# Patient Record
Sex: Male | Born: 1990 | Hispanic: No | Marital: Single | Smoking: Former smoker
Health system: Southern US, Community
[De-identification: ages and names within clinical notes are randomized; demographics above are authoritative.]

## PROBLEM LIST (undated history)

## (undated) HISTORY — PX: APPENDECTOMY: SHX54

---
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
## 2010-09-19 NOTE — H&P (Signed)
NAMEJENCARLOS, NICOLSON             ACCOUNT NO.:  [id number]   MEDICAL RECORD NO.:  [id number]          PATIENT TYPE:  INP   LOCATION:  0098                         FACILITY:  [facility]   PHYSICIAN:  Anselm Pancoast. Weatherly, M.D.DATE OF BIRTH:  05-08-1990   DATE OF ADMISSION:  10/22/2006  DATE OF DISCHARGE:                              HISTORY & PHYSICAL   CHIEF COMPLAINT:  Abdominal pain.   HISTORY:  Colin Fields is a 20 year old Caucasian male who came to  emergency room accompanied by his mother with the following history.   He has had right-sided lower abdominal pain progressive for  approximately 5 weeks.  The pain has gotten much worse over the last 3  days.  His mother says she had noticed that he was not able to stand up  straight.  She did a bunch of internet search and sort of self diagnosed  him as possibly having appendicitis.  For the last 3 days he has had  significant increasing pain; has is started having fever and came to the  emergency room today probably at 3:00 p.m.  He was seen by the ER  physician while I was in the operating room.  They started him on Zosyn,  as he had a CT which showed a markedly inflamed appendix.  He had come  by [facility] and brought copies of the x-ray on the disk.  I reviewed  this and he obviously had a ruptured retrocecal appendix with marked  inflammation of the right lower quadrant.  He has had no laboratory  studies.   PHYSICAL EXAMINATION:  (In the emergency room)  GENERAL:  He is a cooperative young male.  He has rings or buttons on  his lower lips and very large earrings.  He appears uncomfortable.  VITAL SIGNS:  His temperature was 101.9, and I gave him a Tylenol  suppository.  His pulse was originally 116, and then later checked and  it was approximately 100.  His respiration was 20.  Blood pressure was  134/70.  HEENT:  Unremarkable.  LUNGS:  Clear.  ABDOMEN:  He is definitely tender in the lower abdomen.  He seems to  have a fullness in the right lower quadrant.  He definitely has rebound  localized to the right lower abdomen.  RECTAL:  I did not do a rectal examination.  EXTREMITIES:  No pedal edema.   DATA REVIEW:  I reviewed the CT with the patient's mom, and it is  recommended that we proceed with an open appendectomy, since he had a  markedly inflamed appendix retrocecal with fluid in the pelvis, and what  looks like localized infection at the base of the appendix.  This  patient has been started on the Unasyn, his CT was without oral  contrast.  I did review the CT with our radiologist who agreed with the  impression from [facility].   preop diagnosis her ruptured appendicitis, retrocecal postop diagnosis  same operation was in the past appendectomy for ruptured appendix open.  General anesthesia surgery.  We will assisted nurse history Shahiem  noted to the 20 year old male who has  had the, reoccurring progressive  right lower quadrant pain of the patient says 5 weeks duration that is,  and intermittent cramping in the left foot and come back and then over  the last 3 days he has had a significant increasing pain and started  having fever.  He was 71 clinics day and a set of the side face and for  a CT this was done without contrast that shows a markedly inflamed  retrocecal appendix.  He was referred here to the emergency room and we  obtained a CBC and seen that his electrolytes were normal.  BUN of nine  his white count is 15,600 with a medical bit of 46.  As a marked left  shift.  The patient has been given Zosyn intravenously and permission  obtained for an open appendectomy.   The patient was taken to the operative suite position of a table  endotracheal tube and the abdomen after induction of general anesthesia  can feel a definite mass and right lower quadrant first the catheter was  placed is blast early.  A small Rockey-Davis type incision and right  lower quadrant was made  sharp infection and to the skin and subcutaneous  tissue external oblique internal bleed and transversalis the carefully  opened into the peritoneal cavity fortunately there is not a generalized  peritonitis that is.  This the liver tori mass and he really could not  identify where the cecum in the appendix joins since this is kind of  chronically the inflamed.  I can elevate the cecum and then could  identified with where the appendix was just, plastic to the cecum, broke  get up with fingers and the carefully separated the cecum from this  markedly inflamed appendix.  The appendix had ruptured, little walled  off abscess pretty close to its junction with the cecum and the  inflammatory was kind of carefully separated trying to make sure that  did perforated the cecum as we would under trying to separate the  appendix from the cecum.  Hopefully this can be done without actually  had the do a right colectomy on the patient.  The terminal ileum and  junction with the cecum is course his normal physician and after I kind  of free up the mesentery to the appendix.  I can then say that I could  take of TIA 60 across the base of the cecum and actually removed the  appendix in probably about 2 cm of the cecum and get all the  inflammation removed and actually pill across the cecum words not  extremely sick.  This was done the stapler fired and then the inflamed  appendix removed.  I did culture the little periappendiceal abscess the  room and aerobic being thoroughly irrigated and aspirated.  I changed  above.  And then inverted the suture line with interrupted members  sutures of 3-0 silk and recheck where the terminal EOMs cecal junction  and he is in do not think, compromise the ileocecal valve area.  I then  dropped the cecum back in his normal physician to rule second, aspirated  the fluid that was seen on the CT of the pelvis and this looks more like a exudates or transudates and actually  a pelvic abscess.  I placed a  Blake drain in around across the base where the appendix and then  removed and a portion of this, partially goes to the pelvis.  I have  reinspected good hemostasis  sponge count was correct and then I close  the transversalis perineum running 2-0 Vicryl the internal bleed was  closed with interrupted 2-0 Vicryl and the external oblique was closed  with interrupted 2-0 Vicryl irrigated and aspirated between each layers  and then approximately the a Scarpa fascia with two simple sutures of 4-  0 Vicryl and benzoin and Steri-Strips on the skin the drainage been  placed through the abdominal wall below the incision and this was  sutured to the skin with 3-0 silk the patient awake I did remove the  Foley prior to him being awakened and we will keep him n.p.o. keep more  broad antibiotics and then hopefully be able to started the left ear the  to tomorrow.  Was tried not place an NG tube.  Closed.  This will be  added in the transition for into the pediatric is kind.  This.  We will  repeat his operative not as much the hands were correct and estimated  loss was minimal.  Thank you he would           ______________________________  Anselm Pancoast. Zachery Dakins, M.D.     WJW/MEDQ  D:  10/22/2006  T:  10/23/2006  Job:  [id number]

## 2010-09-19 NOTE — Op Note (Signed)
NAMETHEOPHILE, Colin Fields             ACCOUNT NO.:  [id number]   MEDICAL RECORD NO.:  [id number]          PATIENT TYPE:  INP   LOCATION:  0098                         FACILITY:  [facility]   PHYSICIAN:  Anselm Pancoast. Weatherly, M.D.DATE OF BIRTH:  1990/10/06   DATE OF PROCEDURE:  DATE OF DISCHARGE:                               OPERATIVE REPORT   PREOPERATIVE DIAGNOSIS:  Her ruptured appendicitis, retrocecal.   POSTOPERATIVE DIAGNOSIS:  Her ruptured appendicitis, retrocecal.   OPERATION:  Appendectomy for ruptured appendix, open.   ANESTHESIA:  General anesthesia.   SURGEON:  Anselm Pancoast. Zachery Dakins, M.D.   ASSISTANT:  Nurse.   HISTORY:  Colin Fields is a 20 year old male who has had kind of a  reoccurring, progressive right lower quadrant pain of, the patient says,  5 weeks duration.  This has been kind of intermittent and cramping; it  gets less but then comes back.  Then, over the last 3 days, he has had a  significant increasing pain and started having fever.  He was seen in  one of the clinics today and they sent him over to [facility] for a  CT.  This was done without contrast and it showed a markedly inflamed  retrocecal appendix.  He was referred here to the emergency room and we  obtained a CBC and CMET.  His electrolytes were normal.  BUN 9.  His  white count was 15,600, with hematocrit of 46.  He had a marked left  shift.  The patient had been given Zosyn intravenously and permission  was obtained for an open appendectomy.   DESCRIPTION OF PROCEDURE:  The patient was taken to the operative suite;  positioned on the operating room table.  After induction of general  anesthesia, you could feel a definite mass in the right lower quadrant.  First the catheter was placed in his bladder sterilely.  A small Rockey-  Davis type incision in the right lower quadrant was made.  Sharp  dissection made down through the skin and subcutaneous tissue, external  oblique internal oblique  and then transversalis.  Then carefully we  opened into the peritoneal cavity, Fortunately, there was not a  generalized peritonitis, but he had this big inflammatory mass and you  really could not identify where the cecum and the appendix joins --  since this was just kind of chronically inflamed.  I could elevate the  cecum, and then could identify where the appendix was just  kind of  plastered to the cecum.  I kind of broke it up with my fingers and then  carefully separated the cecum from this markedly inflamed appendix.  The  appendix had ruptured, and had kind of a little walled-off abscess  pretty close to its junction with the cecum.  The inflammatory tissue  was kind of carefully separated, trying to make sure that I did not  perforate the cecum, as we were kind of trying to separate the appendix  from the cecum.  Hopefully this can be done without actually having to  do a right colectomy on the patient.  The terminal ileum and junction  with the cecum was in the normal position, and after I had kind of freed  up the mesentery to the appendix, I could then see that I could take TIA  60 across the base of the cecum.  I actually removed the appendix and  probably about 2 cm of the cecum in order to get all the inflammation  removed.  I actually came across the cecum where it was not extremely  thick.  When this was done the stapler fired, and then the inflamed  appendix removed.  I did culture the little periappendiceal abscess;  aerobic and anaerobic being thoroughly irrigated and aspirated.   I changed my gloves, and then I inverted the suture line with  interrupted Lembert sutures of 3-0 silk.  I rechecked where the terminal-  cecal junction is; and I did not think I had compromised the ileocecal  valve area.  I then dropped the cecum back into the normal position.  I  stuck a Poole sucker and kind of aspirated the fluid that was seen on  the CT of the pelvis.  This looked more  like an exudate or transudate  than actually a pelvic abscess.  I placed a Blake drain in, around and  across the base where the appendix had been removed; and a portion of  this kind of partially goes into the pelvis.  I reinspected; good  hemostasis.  The sponge count was correct.   I then closed the transversalis and peritoneum with running 2-0 Vicryl.  The internal oblique was closed with interrupted 2-0 Vicryl, and the  external oblique was closed with interrupted 2-0 Vicryl.  I irrigated  and aspirated between each of the layers.  I then approximated the  Scarpa fascia with 2 simple sutures of 4-0 Vicryl; and benzoin and Steri-  Strips on the skin.  The drain has been placed through the abdominal  wall below the incision, and this was sutured to the skin with 3-0 silk.   The patient awoke.  I did remove the Foley prior to him being awakened.  We will keep him n.p.o.; keep him a broad antibiotic, and then hopefully  will be able to start advancing the diet tomorrow.  I am going to try  and not place an NG tube.   I understand that there are no beds, and they are going  to transfer him  to the pediatric units at [facility].   COUNT:  Sponge and needle counts were correct.   ESTIMATED BLOOD LOSS:  Minimal.           ______________________________  Anselm Pancoast. Zachery Dakins, M.D.     WJW/MEDQ  D:  10/22/2006  T:  10/23/2006  Job:  [id number]

## 2010-09-22 NOTE — Discharge Summary (Signed)
[name], Colin Fields             ACCOUNT NO.:  [id number]   MEDICAL RECORD NO.:  [id number]          PATIENT TYPE:  INP   LOCATION:  6119                         FACILITY:  [facility]   PHYSICIAN:  Anselm Pancoast. 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  Operation was opened, appendectomy with removal of  small area of cecum.   HISTORY:  Colin Fields is a 20 year old male who came to the  emergency room at [facility] with the following history.  He had right-  sided lower abdominal pain for approximately five weeks.  The patient  had gotten much worse over the last three days.  Mother said she first  noticed when he was not able to stand up straight.  The patient and his  mother did an Therapist, art and diagnosed him as having appendicitis  and, for the last three days, he has had increasing pain with fever and  then came to the emergency room at [facility] on October 22, 2006.  He was  seen by the ER physician and they started him on Zosyn.  Did a CT which  showed a marked inflammatory mass in the right lower quadrant.  This was  done at [facility] before coming to the emergency room and it was  consistent with a marked inflammation and a retrocecal appendicitis.  He  had no laboratory studies and on examination his temperature was 102,  pulse was 100, blood pressure was 134/70.  When I saw him, he was  definitely tender with a fullness on the right lower quadrant.  There  was no pedal edema and I was in agreement with the antibiotics.  His BUN  was 9.  His white count was 15,600 and I discussed with the mother and  the patient that I would proceed with an open appendectomy.  He was  taken to the OR at [facility] shortly afterwards and had a markedly  inflamed kind of a mass  retrocecally.  We were able to kind of flip up  the cecum and all this edematous areolar tissues and etc. and it was  definitely a small area of free purulence and the appendix was inflamed  right on the basically to the base of the cecum but I could get a  stapler under it across the base of the cecum, not the appendix, that  did not compromise the ileocecal valve and I __________  to remove this  inflammatory process.  The pathologist confirmed that this is definitely  acutely ruptured appendicitis.  The portion of the cecum is benign but  showed marked inflammation.  There were no beds and, since he was a  pediatric patient, he was transferred to the pediatric unit here at [facility]  as mother and family members stayed with him most of the time and it was  necessary to encourage him to cough, deep breath and etc.  He had a  Blake drain  that had very little drainage.  At first, he did have some  fever and I expect it was probably more related to pulmonary toilet  coughing than actually progression of the inflammatory process.  After  approximately 24 hours, however, he clinically improved.  We started him  on a liquid diet.  His temperature came down and a repeat white count  showed improvement.  He was started on a liquid diet and this advanced.  A white count was done on a Saturday which was about four days after he  was admitted and the white count returned to normal and he was ready for  discharge the following morning.  He still has his Blake drain and I  removed the [place] drain immediately before discharging him and will  follow up in the office in approximately a week.  I am going to  discharge him to continue oral antibiotics, Augmentin 875 mg b.i.d. for  five days, Vicodin for pain and he can shower the day after the drain  was removed and will remove the skin staples in the office.           ______________________________  Anselm Pancoast. Zachery Dakins, M.D.    WJW/MEDQ  D:  12/05/2006   T:  12/05/2006  Job:  [id number]

## 2011-02-21 LAB — DIFFERENTIAL
Basophils Absolute: 0
Basophils Absolute: 0
Basophils Relative: 0
Eosinophils Absolute: 0.1
Eosinophils Relative: 1
Lymphocytes Relative: 10 — ABNORMAL LOW
Lymphocytes Relative: 16 — ABNORMAL LOW
Lymphocytes Relative: 6 — ABNORMAL LOW
Lymphs Abs: 1.5
Monocytes Absolute: 0.8
Monocytes Relative: 6
Neutro Abs: 11.8 — ABNORMAL HIGH
Neutro Abs: 13.2 — ABNORMAL HIGH
Neutrophils Relative %: 85 — ABNORMAL HIGH

## 2011-02-21 LAB — CBC
HCT: 36.4
HCT: 40.1
HCT: 45.9 — ABNORMAL HIGH
Hemoglobin: 13.1
Hemoglobin: 13.5
MCHC: 33.6
MCHC: 34.2 — ABNORMAL HIGH
MCV: 83.8
MCV: 85.4
Platelets: 250
Platelets: 254
RBC: 4.57
RBC: 5.48 — ABNORMAL HIGH
RDW: 12.6
WBC: 11.7
WBC: 13.4 — ABNORMAL HIGH

## 2011-02-21 LAB — ANAEROBIC CULTURE

## 2011-02-21 LAB — COMPREHENSIVE METABOLIC PANEL
AST: 17
BUN: 9
CO2: 26
Calcium: 10.2
Creatinine, Ser: 0.91

## 2011-02-21 LAB — BASIC METABOLIC PANEL
BUN: 4 — ABNORMAL LOW
Chloride: 100
Glucose, Bld: 128 — ABNORMAL HIGH
Potassium: 4.1

## 2021-08-26 ENCOUNTER — Encounter (HOSPITAL_COMMUNITY): Payer: Self-pay

## 2021-08-26 ENCOUNTER — Other Ambulatory Visit: Payer: Self-pay

## 2021-08-26 ENCOUNTER — Emergency Department (HOSPITAL_COMMUNITY)
Admission: EM | Admit: 2021-08-26 | Discharge: 2021-08-26 | Disposition: A | Discharge status: Home / Self Care | Payer: Self-pay | Attending: Emergency Medicine | Admitting: Emergency Medicine

## 2021-08-26 DIAGNOSIS — H9203 Otalgia, bilateral: Secondary | ICD-10-CM | POA: Insufficient documentation

## 2021-08-26 DIAGNOSIS — J069 Acute upper respiratory infection, unspecified: Secondary | ICD-10-CM | POA: Insufficient documentation

## 2021-08-26 DIAGNOSIS — J029 Acute pharyngitis, unspecified: Secondary | ICD-10-CM

## 2021-08-26 DIAGNOSIS — Z20822 Contact with and (suspected) exposure to covid-19: Secondary | ICD-10-CM | POA: Insufficient documentation

## 2021-08-26 DIAGNOSIS — M542 Cervicalgia: Secondary | ICD-10-CM | POA: Insufficient documentation

## 2021-08-26 LAB — RESP PANEL BY RT-PCR (FLU A&B, COVID) ARPGX2
Influenza A by PCR: NEGATIVE
Influenza B by PCR: NEGATIVE
SARS Coronavirus 2 by RT PCR: NEGATIVE

## 2021-08-26 LAB — GROUP A STREP BY PCR: Group A Strep by PCR: NOT DETECTED

## 2021-08-26 NOTE — ED Notes (Signed)
Pt is able to place chin to chest and has full ROM of neck.  Sts soreness is mainly in the morning.  ?

## 2021-08-26 NOTE — ED Provider Notes (Signed)
?[facility]-EMERGENCY DEPT ?Provider Note ? ? ?CSN: [id number] ?Arrival date & time: 08/26/21  1823 ? ?  ? ?History ? ?Chief Complaint  ?Patient presents with  ? URI  ? Fever  ? ? ?Colin Fields is a 31 y.o. male who presents to the ED complaining of sore throat onset 3 days.  Has possible sick contacts at his place of employment.  Has associated nasal congestion, bilateral ear pressure, fever, neck pain, painful swallowing.  Has tried naproxen with his last dose being an hour prior to arrival.  Denies trouble swallowing. ? ?The history is provided by the patient. No language interpreter was used.  ? ?  ? ?Home Medications ?Prior to Admission medications   ?Not on File  ?   ? ?Allergies    ?Patient has no known allergies.   ? ?Review of Systems   ?Review of Systems  ?Constitutional:  Positive for fever.  ?HENT:  Positive for congestion, ear pain and sore throat. Negative for trouble swallowing.   ?Respiratory:  Positive for cough.   ?Musculoskeletal:  Positive for neck pain.  ?All other systems reviewed and are negative. ? ?Physical Exam ?Updated Vital Signs ?BP (!) 153/88 (BP Location: Left Arm)   Pulse (!) 105   Temp 99.7 ?F (37.6 ?C) (Oral)   Resp 18   Ht 5\' 11"  (1.803 m)   Wt 95.3 kg   SpO2 96%   BMI 29.29 kg/m?  ?Physical Exam ?Vitals and nursing note reviewed.  ?Constitutional:   ?   General: He is not in acute distress. ?   Appearance: He is not diaphoretic.  ?HENT:  ?   Head: Normocephalic and atraumatic.  ?   Right Ear: Tympanic membrane, ear canal and external ear normal.  ?   Left Ear: Tympanic membrane, ear canal and external ear normal.  ?   Nose: Nose normal.  ?   Mouth/Throat:  ?   Mouth: Mucous membranes are moist.  ?   Pharynx: Oropharynx is clear. Uvula midline. No oropharyngeal exudate or uvula swelling.  ?   Tonsils: Tonsillar exudate present.  ?   Comments: Exudate noted to left tonsil.  Uvula midline without swelling. ?Eyes:  ?   General: No scleral icterus. ?    Conjunctiva/sclera: Conjunctivae normal.  ?Neck:  ?   Meningeal: Brudzinski's sign and Kernig's sign absent.  ?   Comments: Full active range of motion of neck without pain.  No cervical spinal tenderness to palpation.  No tenderness to palpation noted to musculature of cervical spine.  Negative presents keys and negative Kernig sign. ?Cardiovascular:  ?   Rate and Rhythm: Normal rate and regular rhythm.  ?   Pulses: Normal pulses.  ?   Heart sounds: Normal heart sounds.  ?Pulmonary:  ?   Effort: Pulmonary effort is normal. No respiratory distress.  ?   Breath sounds: Normal breath sounds. No wheezing.  ?Abdominal:  ?   General: Bowel sounds are normal.  ?   Palpations: Abdomen is soft. There is no mass.  ?   Tenderness: There is no abdominal tenderness. There is no guarding or rebound.  ?Musculoskeletal:     ?   General: Normal range of motion.  ?   Cervical back: Full passive range of motion without pain, normal range of motion and neck supple. No rigidity. No pain with movement, spinous process tenderness or muscular tenderness. Normal range of motion.  ?Skin: ?   General: Skin is warm and dry.  ?  Neurological:  ?   Mental Status: He is alert.  ?Psychiatric:     ?   Behavior: Behavior normal.  ? ? ?ED Results / Procedures / Treatments   ?Labs ?(all labs ordered are listed, but only abnormal results are displayed) ?Labs Reviewed  ?RESP PANEL BY RT-PCR (FLU A&B, COVID) [id number]  ?GROUP A STREP BY PCR  ? ? ?EKG ?None ? ?Radiology ?No results found. ? ?Procedures ?Procedures  ? ? ?Medications Ordered in ED ?Medications - No data to display ? ?ED Course/ Medical Decision Making/ A&P ?Clinical Course as of 08/26/21 2121  ?Sat Aug 26, 2021  ?[id number] Discussed with patient regarding treatment plan with COVID, flu, strep swabs and chest x-ray.  Patient notes that this time that he would prefer just the swabs and not the x-ray due to financial concerns.  Agreeable with proceeding with swabs only at this time. [SB]  ?2007  Discussed with patient regarding swab findings.  Discussed discharge treatment plan.  Answered all available questions.  Patient appears safe for discharge at this time. [SB]  ?  ?Clinical Course User Index ?[SB] Manjinder Breau A, PA-C  ? ?                        ?Medical Decision Making ? ?Pt presents with sore throat, nasal congestion, bilateral ear pressure, fever onset 3 days.  Has possible sick contacts at his place of employment. ?Vital signs, patient slightly tachycardic at 105, temperature 99.7.  On exam patient with moist mucous membranes, uvula midline without swelling, exudate noted to left tonsil.  No acute cardiovascular or respiratory exam findings. Differential diagnosis includes COVID, flu, viral URI with cough, strep pharyngitis, viral pharyngitis.  ? ?Labs:  ?I ordered, and personally interpreted labs.  The pertinent results include:   ?COVID and flu swab negative ?Strep swab negative ? ? ?Disposition: ?Patient presentation suspicious for viral URI with cough and viral pharyngitis.  Doubt COVID, flu, strep pharyngitis at this time.  After consideration of the diagnostic results and the patients response to treatment, I feel that the patient would benefit from Discharge home. I was not notified of patient's elevated temperature at time of discharge, would have gave patient Tylenol prior to discharge.  Upon review of vital signs, notes that patient refused further care. Supportive care measures and strict return precautions discussed with patient at bedside. Pt acknowledges and verbalizes understanding. Pt appears safe for discharge. Follow up as indicated in discharge paperwork.  ? ? ?This chart was dictated using voice recognition software, Dragon. Despite the best efforts of this provider to proofread and correct errors, errors may still occur which can change documentation meaning. ? ? ?Final Clinical Impression(s) / ED Diagnoses ?Final diagnoses:  ?Viral URI with cough  ?Viral pharyngitis   ? ? ?Rx / DC Orders ?ED Discharge Orders   ? ? None  ? ?  ? ? ?  ?Nahara Dona A, PA-C ?08/26/21 2122 ? ?  ?Benjiman Core, MD ?08/26/21 2317 ? ?

## 2021-08-26 NOTE — ED Triage Notes (Signed)
Pt c/o sore throat, congestion, ear pressure, and fever x3 days.  Pain score 3/10.  Pt reports taking Naproxen x1 hr ago.  ?

## 2021-08-26 NOTE — Discharge Instructions (Addendum)
It was a pleasure taking care of you!  ? ?Your COVID, flu, strep swabs were negative at this time. You may take over the counter 600 mg Ibuprofen every 6 hours or 500 mg Tylenol every 6 hours as needed for pain for no more than 7 days. Ensure to maintain fluid intake with tea, soup, broth, Pedialyte, Gatorade, water. You may follow-up with your primary care provider as needed.  Return to the Emergency Department if you are experiencing trouble breathing, worsening or increasing chest pain, decreased fluid intake or worsening symptoms. ?
